# Patient Record
Sex: Male | Born: 1970 | Race: White | Hispanic: No | Marital: Married | Smoking: Never smoker
Health system: Southern US, Community
[De-identification: ages and names within clinical notes are randomized; demographics above are authoritative.]

---

## 2017-11-28 ENCOUNTER — Emergency Department (HOSPITAL_BASED_OUTPATIENT_CLINIC_OR_DEPARTMENT_OTHER)
Admission: EM | Admit: 2017-11-28 | Discharge: 2017-11-29 | Disposition: A | Discharge status: Home / Self Care | Payer: BLUE CROSS/BLUE SHIELD | Attending: Emergency Medicine | Admitting: Emergency Medicine

## 2017-11-28 ENCOUNTER — Other Ambulatory Visit: Payer: Self-pay

## 2017-11-28 ENCOUNTER — Encounter (HOSPITAL_BASED_OUTPATIENT_CLINIC_OR_DEPARTMENT_OTHER): Payer: Self-pay | Admitting: *Deleted

## 2017-11-28 DIAGNOSIS — Y92410 Unspecified street and highway as the place of occurrence of the external cause: Secondary | ICD-10-CM | POA: Diagnosis not present

## 2017-11-28 DIAGNOSIS — Y999 Unspecified external cause status: Secondary | ICD-10-CM | POA: Insufficient documentation

## 2017-11-28 DIAGNOSIS — Y9389 Activity, other specified: Secondary | ICD-10-CM | POA: Insufficient documentation

## 2017-11-28 DIAGNOSIS — S161XXA Strain of muscle, fascia and tendon at neck level, initial encounter: Secondary | ICD-10-CM | POA: Diagnosis not present

## 2017-11-28 DIAGNOSIS — S3991XA Unspecified injury of abdomen, initial encounter: Secondary | ICD-10-CM | POA: Diagnosis not present

## 2017-11-28 DIAGNOSIS — S199XXA Unspecified injury of neck, initial encounter: Secondary | ICD-10-CM | POA: Diagnosis present
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
## 2017-11-28 NOTE — ED Notes (Signed)
ED Provider at bedside. 
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
## 2017-11-29 ENCOUNTER — Emergency Department (HOSPITAL_BASED_OUTPATIENT_CLINIC_OR_DEPARTMENT_OTHER): Payer: BLUE CROSS/BLUE SHIELD

## 2017-11-29 DIAGNOSIS — S161XXA Strain of muscle, fascia and tendon at neck level, initial encounter: Secondary | ICD-10-CM | POA: Diagnosis not present

## 2017-11-29 LAB — CBC
HCT: 47.1 % (ref 39.0–52.0)
Hemoglobin: 15.2 g/dL (ref 13.0–17.0)
MCH: 27.5 pg (ref 26.0–34.0)
MCHC: 32.3 g/dL (ref 30.0–36.0)
MCV: 85.3 fL (ref 80.0–100.0)
Platelets: 284 10*3/uL (ref 150–400)
RBC: 5.52 MIL/uL (ref 4.22–5.81)
RDW: 12.2 % (ref 11.5–15.5)
WBC: 8.1 10*3/uL (ref 4.0–10.5)
nRBC: 0 % (ref 0.0–0.2)

## 2017-11-29 LAB — BASIC METABOLIC PANEL
Anion gap: 9 (ref 5–15)
BUN: 20 mg/dL (ref 6–20)
CO2: 23 mmol/L (ref 22–32)
CREATININE: 1.04 mg/dL (ref 0.61–1.24)
Calcium: 9.3 mg/dL (ref 8.9–10.3)
Chloride: 105 mmol/L (ref 98–111)
GFR calc Af Amer: >60 mL/min (ref >60)
GFR calc non Af Amer: >60 mL/min (ref >60)
GLUCOSE: 88 mg/dL (ref 70–99)
POTASSIUM: 3.7 mmol/L (ref 3.5–5.1)
SODIUM: 137 mmol/L (ref 135–145)

## 2017-11-29 MED ORDER — METHOCARBAMOL 500 MG PO TABS: 500.0000 mg | ORAL_TABLET | Freq: Three times a day (TID) | ORAL | 0 refills | Status: AC | PRN

## 2017-11-29 MED ORDER — IOPAMIDOL (ISOVUE-300) INJECTION 61%
100.0000 mL | Freq: Once | INTRAVENOUS | Status: AC | PRN
  Administered 2017-11-29: 100 mL via INTRAVENOUS

## 2017-11-29 NOTE — ED Notes (Signed)
Pt given Rx x 1 for robaxin. Dr. Blinda Leatherwood at bedside to discuss d/c instructions
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
